# Patient Record
Sex: Male | Born: 1990 | Hispanic: Yes | Marital: Married | Smoking: Current some day smoker
Health system: Southern US, Community
[De-identification: ages and names within clinical notes are randomized; demographics above are authoritative.]

---

## 2013-10-22 ENCOUNTER — Emergency Department (HOSPITAL_COMMUNITY)
Admission: EM | Admit: 2013-10-22 | Discharge: 2013-10-22 | Disposition: A | Discharge status: Home / Self Care | Payer: Self-pay | Attending: Emergency Medicine | Admitting: Emergency Medicine

## 2013-10-22 ENCOUNTER — Encounter (HOSPITAL_COMMUNITY): Payer: Self-pay | Admitting: Emergency Medicine

## 2013-10-22 ENCOUNTER — Emergency Department (HOSPITAL_COMMUNITY): Payer: Self-pay

## 2013-10-22 DIAGNOSIS — R630 Anorexia: Secondary | ICD-10-CM | POA: Insufficient documentation

## 2013-10-22 DIAGNOSIS — Z720 Tobacco use: Secondary | ICD-10-CM | POA: Insufficient documentation

## 2013-10-22 DIAGNOSIS — R112 Nausea with vomiting, unspecified: Secondary | ICD-10-CM | POA: Insufficient documentation

## 2013-10-22 DIAGNOSIS — R109 Unspecified abdominal pain: Secondary | ICD-10-CM

## 2013-10-22 DIAGNOSIS — R6883 Chills (without fever): Secondary | ICD-10-CM | POA: Insufficient documentation

## 2013-10-22 DIAGNOSIS — R1011 Right upper quadrant pain: Secondary | ICD-10-CM | POA: Insufficient documentation

## 2013-10-22 DIAGNOSIS — R1013 Epigastric pain: Secondary | ICD-10-CM | POA: Insufficient documentation

## 2013-10-22 LAB — CBC WITH DIFFERENTIAL/PLATELET
Basophils Absolute: 0.1 10*3/uL (ref 0.0–0.1)
Basophils Relative: 0 % (ref 0–1)
EOS ABS: 0.7 10*3/uL (ref 0.0–0.7)
Eosinophils Relative: 5 % (ref 0–5)
HCT: 46.2 % (ref 39.0–52.0)
HEMOGLOBIN: 16.7 g/dL (ref 13.0–17.0)
LYMPHS ABS: 1.9 10*3/uL (ref 0.7–4.0)
Lymphocytes Relative: 13 % (ref 12–46)
MCH: 30.8 pg (ref 26.0–34.0)
MCHC: 36.1 g/dL — ABNORMAL HIGH (ref 30.0–36.0)
MCV: 85.1 fL (ref 78.0–100.0)
Monocytes Absolute: 0.6 10*3/uL (ref 0.1–1.0)
Monocytes Relative: 4 % (ref 3–12)
NEUTROS PCT: 78 % — AB (ref 43–77)
Neutro Abs: 11.5 10*3/uL — ABNORMAL HIGH (ref 1.7–7.7)
Platelets: 317 10*3/uL (ref 150–400)
RBC: 5.43 MIL/uL (ref 4.22–5.81)
RDW: 12.7 % (ref 11.5–15.5)
WBC: 14.7 10*3/uL — ABNORMAL HIGH (ref 4.0–10.5)

## 2013-10-22 LAB — COMPREHENSIVE METABOLIC PANEL
ALBUMIN: 4.6 g/dL (ref 3.5–5.2)
ALK PHOS: 123 U/L — AB (ref 39–117)
ALT: 61 U/L — AB (ref 0–53)
ANION GAP: 15 (ref 5–15)
AST: 54 U/L — ABNORMAL HIGH (ref 0–37)
BUN: 10 mg/dL (ref 6–23)
CHLORIDE: 97 meq/L (ref 96–112)
CO2: 28 mEq/L (ref 19–32)
Calcium: 9.6 mg/dL (ref 8.4–10.5)
Creatinine, Ser: 0.66 mg/dL (ref 0.50–1.35)
GFR calc Af Amer: >90 mL/min (ref >90)
GFR calc non Af Amer: >90 mL/min (ref >90)
Glucose, Bld: 122 mg/dL — ABNORMAL HIGH (ref 70–99)
POTASSIUM: 3.6 meq/L — AB (ref 3.7–5.3)
SODIUM: 140 meq/L (ref 137–147)
Total Bilirubin: 0.9 mg/dL (ref 0.3–1.2)
Total Protein: 8.6 g/dL — ABNORMAL HIGH (ref 6.0–8.3)

## 2013-10-22 LAB — URINALYSIS, ROUTINE W REFLEX MICROSCOPIC
Bilirubin Urine: NEGATIVE
Glucose, UA: NEGATIVE mg/dL
HGB URINE DIPSTICK: NEGATIVE
Ketones, ur: NEGATIVE mg/dL
Leukocytes, UA: NEGATIVE
NITRITE: NEGATIVE
Protein, ur: NEGATIVE mg/dL
SPECIFIC GRAVITY, URINE: 1.02 (ref 1.005–1.030)
Urobilinogen, UA: 0.2 mg/dL (ref 0.0–1.0)
pH: 6 (ref 5.0–8.0)

## 2013-10-22 LAB — LIPASE, BLOOD: Lipase: 21 U/L (ref 11–59)

## 2013-10-22 MED ORDER — IOHEXOL 300 MG/ML  SOLN
100.0000 mL | Freq: Once | INTRAMUSCULAR | Status: AC | PRN
  Administered 2013-10-22: 100 mL via INTRAVENOUS

## 2013-10-22 MED ORDER — ONDANSETRON HCL 4 MG/2ML IJ SOLN
4.0000 mg | Freq: Once | INTRAMUSCULAR | Status: AC
  Administered 2013-10-22: 4 mg via INTRAVENOUS

## 2013-10-22 MED ORDER — ONDANSETRON HCL 4 MG PO TABS: 4.0000 mg | ORAL_TABLET | Freq: Three times a day (TID) | ORAL | Status: AC | PRN

## 2013-10-22 MED ORDER — SODIUM CHLORIDE 0.9 % IJ SOLN
INTRAMUSCULAR | Status: AC
  Filled 2013-10-22: qty 400

## 2013-10-22 MED ORDER — ONDANSETRON HCL 4 MG/2ML IJ SOLN
4.0000 mg | Freq: Once | INTRAMUSCULAR | Status: DC
  Filled 2013-10-22: qty 2

## 2013-10-22 MED ORDER — HYDROMORPHONE HCL 1 MG/ML IJ SOLN
1.0000 mg | Freq: Once | INTRAMUSCULAR | Status: AC
  Administered 2013-10-22: 1 mg via INTRAVENOUS
  Filled 2013-10-22: qty 1

## 2013-10-22 MED ORDER — SODIUM CHLORIDE 0.9 % IV BOLUS (SEPSIS)
1000.0000 mL | Freq: Once | INTRAVENOUS | Status: AC
  Administered 2013-10-22: 1000 mL via INTRAVENOUS

## 2013-10-22 MED ORDER — IOHEXOL 300 MG/ML  SOLN
50.0000 mL | Freq: Once | INTRAMUSCULAR | Status: AC | PRN
Start: 2013-10-22 — End: 2013-10-22
  Administered 2013-10-22: 50 mL via ORAL

## 2013-10-22 MED ORDER — HYDROCODONE-ACETAMINOPHEN 5-325 MG PO TABS: 2.0000 | ORAL_TABLET | ORAL | Status: AC | PRN

## 2013-10-22 MED ORDER — OXYCODONE-ACETAMINOPHEN 5-325 MG PO TABS: 1.0000 | ORAL_TABLET | Freq: Four times a day (QID) | ORAL | Status: AC | PRN

## 2013-10-22 NOTE — ED Notes (Signed)
Pt alert & oriented x4, stable gait. Patient  given discharge instructions, paperwork & prescription(s). Patient verbalized understanding. Pt left department w/ no further questions. 

## 2013-10-22 NOTE — ED Notes (Signed)
MD at bedside. 

## 2013-10-22 NOTE — ED Provider Notes (Signed)
CSN: [id number]     Arrival date & time 10/22/13  1718 History   First MD Initiated Contact with Patient 10/22/13 1743     Chief Complaint  Patient presents with  . Abdominal Pain     (Consider location/radiation/quality/duration/timing/severity/associated sxs/prior Treatment) Patient is a 23 y.o. male presenting with abdominal pain. The history is provided by a friend and the patient. The history is limited by a language barrier.  Abdominal Pain Pain location:  Epigastric Pain quality: aching   Pain radiates to:  Does not radiate Pain severity:  Moderate Onset quality:  Gradual Duration:  1 day Timing:  Constant Progression:  Unchanged Chronicity:  New Context: suspicious food intake (tamales yesterday)   Relieved by:  Nothing Worsened by:  Palpation and eating Ineffective treatments:  None tried Associated symptoms: anorexia, chills, nausea and vomiting   Associated symptoms: no constipation, no diarrhea and no fever     History reviewed. No pertinent past medical history. History reviewed. No pertinent past surgical history. No family history on file. History  Substance Use Topics  . Smoking status: Current Some Day Smoker  . Smokeless tobacco: Not on file  . Alcohol Use: Yes     Comment: occasional    Review of Systems  Constitutional: Positive for chills. Negative for fever.  Gastrointestinal: Positive for nausea, vomiting, abdominal pain and anorexia. Negative for diarrhea and constipation.  All other systems reviewed and are negative.     Allergies  Review of patient's allergies indicates no known allergies.  Home Medications   Prior to Admission medications   Medication Sig Start Date End Date Taking? Authorizing Provider  bismuth subsalicylate (PEPTO BISMOL) 262 MG/15ML suspension Take 30 mLs by mouth every 6 (six) hours as needed for indigestion.   Yes Historical Provider, MD  HYDROcodone-acetaminophen (NORCO/VICODIN) 5-325 MG per tablet Take 2  tablets by mouth every 4 (four) hours as needed for moderate pain. 10/22/13   Mirian Mo, MD  ondansetron (ZOFRAN) 4 MG tablet Take 1 tablet (4 mg total) by mouth every 8 (eight) hours as needed for nausea or vomiting. 10/22/13   Mirian Mo, MD  oxyCODONE-acetaminophen (PERCOCET/ROXICET) 5-325 MG per tablet Take 1-2 tablets by mouth every 6 (six) hours as needed for severe pain. 10/22/13   Mirian Mo, MD   BP 125/68  Pulse 73  Temp(Src) 98.4 F (36.9 C) (Oral)  Resp 20  Wt 141 lb 11.2 oz (64.275 kg)  SpO2 98% Physical Exam  Vitals reviewed. Constitutional: He is oriented to person, place, and time. He appears well-developed and well-nourished.  HENT:  Head: Normocephalic and atraumatic.  Eyes: Conjunctivae and EOM are normal.  Neck: Normal range of motion. Neck supple.  Cardiovascular: Normal rate, regular rhythm and normal heart sounds.   Pulmonary/Chest: Effort normal and breath sounds normal. No respiratory distress.  Abdominal: He exhibits no distension. There is tenderness in the right upper quadrant and epigastric area. There is no rebound and no guarding.  Musculoskeletal: Normal range of motion.  Neurological: He is alert and oriented to person, place, and time.  Skin: Skin is warm and dry.    ED Course  Procedures (including critical care time) Labs Review Labs Reviewed  COMPREHENSIVE METABOLIC PANEL - Abnormal; Notable for the following:    Potassium 3.6 (*)    Glucose, Bld 122 (*)    Total Protein 8.6 (*)    AST 54 (*)    ALT 61 (*)    Alkaline Phosphatase 123 (*)    All  other components within normal limits  CBC WITH DIFFERENTIAL - Abnormal; Notable for the following:    WBC 14.7 (*)    MCHC 36.1 (*)    Neutrophils Relative % 78 (*)    Neutro Abs 11.5 (*)    All other components within normal limits  URINALYSIS, ROUTINE W REFLEX MICROSCOPIC  LIPASE, BLOOD    Imaging Review Ct Abdomen Pelvis W Contrast  10/22/2013   CLINICAL DATA:  Initial  encounter for epigastric pain lasting 1 day.  EXAM: CT ABDOMEN AND PELVIS WITH CONTRAST  TECHNIQUE: Multidetector CT imaging of the abdomen and pelvis was performed using the standard protocol following bolus administration of intravenous contrast.  CONTRAST:  50mL OMNIPAQUE IOHEXOL 300 MG/ML SOLN, 100mL OMNIPAQUE IOHEXOL 300 MG/ML SOLN  COMPARISON:  None.  FINDINGS: Subsegmental atelectasis is present at the lung bases bilaterally. The heart size is normal. No significant pleural or pericardial effusion is present.  The liver and spleen are within normal limits. The stomach, duodenum, and pancreas are within normal limits. The common bile duct and gallbladder are normal. The adrenal glands are normal bilaterally. The kidneys and ureters are unremarkable.  The rectosigmoid colon is within normal limits. The remainder the colon is within normal limits. The appendix is visualized and normal. Small bowel is unremarkable.  The bone windows demonstrate no focal lytic or blastic lesions.  IMPRESSION: Normal appearance of the abdomen and pelvis. No acute or focal lesion to explain the patient's symptoms.   Electronically Signed   By: Gennette Pachris  Mattern M.D.   On: 10/22/2013 21:15     EKG Interpretation None      MDM   Final diagnoses:  Non-intractable vomiting with nausea, vomiting of unspecified type  Abdominal pain, acute    23 y.o. male without pertinent PMH presents with abdominal pain and vomiting x1 day. Patient suspicious food intake yesterday. On arrival today vitals signs and physical exam as above. Patient has been tolerant of by mouth intake. As he has epigastric and right upper quadrant tenderness obtained lab work and CT scan as above. Labwork significant for mild transaminitis with elevated alkaline phosphatase, however CT scan was unremarkable.  Nature of constant pain and location make cholecystitis less likely, and with negative CT scan consider this unlikely. It is possible that the patient has  a viral gastritis versus early presentation of hepatitis. As he is tolerant of by mouth intake and has pain under control will have him followup with GI asap.  Patient was given standard return precautions, voiced understanding and agreed to followup.    1. Non-intractable vomiting with nausea, vomiting of unspecified type   2. Abdominal pain, acute         Mirian MoMatthew Ritika Hellickson, MD 10/22/13 2228

## 2013-10-22 NOTE — Discharge Instructions (Signed)
Dolor abdominal (Abdominal Pain) El dolor puede tener muchas causas. Normalmente la causa del dolor abdominal no es una enfermedad y Scientist, clinical (histocompatibility and immunogenetics) sin TEFL teacher. Frecuentemente puede controlarse y tratarse en casa. Su mdico le Medical sales representative examen fsico y posiblemente solicite anlisis de sangre y radiografas para ayudar a Chief Strategy Officer la gravedad de su dolor. Sin embargo, en [employer], debe transcurrir ms tiempo antes de que se pueda Clinical research associate una causa evidente del dolor. Antes de llegar a ese punto, es posible que su mdico no sepa si necesita ms pruebas o un tratamiento ms profundo. INSTRUCCIONES PARA EL CUIDADO EN EL HOGAR  Est atento al dolor para ver si hay cambios. Las siguientes indicaciones ayudarn a Architectural technologist que pueda sentir:  [place] solo medicamentos de venta libre o recetados, segn las indicaciones del mdico.  No tome laxantes a menos que se lo haya indicado su mdico.  Pruebe con [place]omia Dear dieta lquida absoluta (caldo, t o agua) segn se lo indique su mdico. Introduzca gradualmente una dieta normal, segn su tolerancia. SOLICITE ATENCIN MDICA SI:  Tiene dolor abdominal sin explicacin.  Tiene dolor abdominal relacionado con nuseas o diarrea.  Tiene dolor cuando orina o defeca.  Experimenta dolor abdominal que lo despierta de noche.  Tiene dolor abdominal que empeora o mejora cuando come alimentos.  Tiene dolor abdominal que empeora cuando come alimentos grasosos.  Tiene fiebre. SOLICITE ATENCIN MDICA DE INMEDIATO SI:   El dolor no desaparece en un plazo mximo de 2horas.  No deja de (vomitar).  El Engineer, mining se siente solo en partes del abdomen, como el lado derecho o la parte inferior izquierda del abdomen.  Evaca materia fecal sanguinolenta o negra, de aspecto alquitranado. ASEGRESE DE QUE:  Comprende estas instrucciones.  Controlar su afeccin.  Recibir ayuda de inmediato si no mejora o si empeora. Document Released: 01/05/2005  Document Revised: 01/10/2013 [facility] Patient Information 2015 [place], [place]. This information is not intended to replace advice given to you by your health care provider. Make sure you discuss any questions you have with your health care provider. Gastroenteritis viral (Viral Gastroenteritis) La gastroenteritis viral tambin es conocida como gripe del [place]. Este trastorno [employer] y el tubo digestivo. Puede causar diarrea y vmitos repentinos. La enfermedad generalmente dura entre 3 y [address]. La [employer] de las personas desarrolla una respuesta inmunolgica. Con el tiempo, esto elimina el virus. Mientras se desarrolla esta respuesta natural, el virus puede afectar en forma importante su salud.  CAUSAS Muchos virus diferentes pueden causar gastroenteritis, por ejemplo el rotavirus o el norovirus. Estos virus pueden contagiarse al consumir alimentos o agua contaminados. Tambin puede contagiarse al compartir utensilios u otros artculos personales con una persona infectada o al tocar una superficie contaminada.  SNTOMAS Los sntomas ms comunes son diarrea y vmitos. Estos problemas pueden causar una prdida grave de lquidos corporales(deshidratacin) y un desequilibrio de sales corporales(electrolitos). Otros sntomas pueden ser:   Grant Ruts.  Dolor de [place].  Fatiga.  Dolor abdominal. DIAGNSTICO  El mdico podr hacer el diagnstico de gastroenteritis viral basndose en los sntomas y el examen fsico Tambin pueden tomarle una muestra de materia fecal para diagnosticar la presencia de virus u otras infecciones.  TRATAMIENTO Esta enfermedad generalmente desaparece sin tratamiento. Los tratamientos estn dirigidos a Social research officer, government. Los casos ms graves de gastroenteritis viral implican vmitos tan intensos que no es posible retener lquidos. En [employer], los lquidos deben administrarse a travs de una va intravenosa (IV).  INSTRUCCIONES PARA EL CUIDADO DOMICILIARIO  Albesa Seen  suficientes lquidos para mantener la orina clara o de color amarillo plido. Beba pequeas cantidades de lquido con frecuencia y aumente la cantidad segn la tolerancia.  Pida instrucciones especficas a su mdico con respecto a la rehidratacin.  Evite:  Alimentos que Nurse, adulttengan mucha azcar.  Alcohol.  Gaseosas.  Tabaco[place]ista Lawman.  Jugos.  Bebidas con cafena.  Lquidos muy calientes o fros.  Alimentos muy grasos.  Comer demasiado a Licensed conveyancerla vez.  Productos lcteos hasta 24 a 48 horas despus de que se detenga la diarrea.  Puede consumir probiticos. Los probiticos son cultivos activos de bacterias beneficiosas. Pueden disminuir la cantidad y el nmero de deposiciones diarreicas en el adulto. Se encuentran en los yogures con cultivos activos y en los suplementos.  Lave bien sus manos para evitar que se disemine el virus.  Slo tome medicamentos de venta libre o recetados para Primary school teachercalmar el dolor, las molestias o bajar la fiebre segn las indicaciones de su mdico. No administre aspirina a los nios. Los medicamentos antidiarreicos no son recomendables.  Consulte a su mdico si puede seguir tomando sus medicamentos recetados o de H. J. Heinzventa libre.  [place] con todas las visitas de control, segn le indique su mdico. SOLICITE ATENCIN MDICA DE INMEDIATO SI:  No puede retener lquidos.  No hay emisin de orina durante 6 a 8 horas.  Le falta el aire.  Observa sangre en el vmito (se ve como caf molido) o en la materia fecal.  Siente dolor abdominal que empeora o se concentra en una zona pequea (se localiza).  Tiene nuseas o vmitos persistentes.  Tiene fiebre.  El paciente es un nio menor de 3 meses y [place] fiebre.  El paciente es un nio mayor de 3 meses, tiene fiebre y sntomas persistentes.  El paciente es un nio mayor de 3 meses y tiene fiebre y sntomas que empeoran repentinamente.  El paciente es un beb y no tiene lgrimas cuando llora. ASEGRESE QUE:   Comprende estas  instrucciones.  Controlar su enfermedad.  Solicitar ayuda inmediatamente si no mejora o si empeora. Document Released: 01/05/2005 Document Revised: 03/30/2011 [facility] Patient Information 2015 [place], [place]. This information is not intended to replace advice given to you by your health care provider. Make sure you discuss any questions you have with your health care provider.

## 2013-10-22 NOTE — ED Notes (Signed)
Pt has mid upper abdominal pain which began today and has been associated with nausea and chills, no fever.  Pt denies any vomiting or diarrhea, LBM today and normal.  Pt denies any urinary symptoms.

## 2013-10-30 MED FILL — Oxycodone w/ Acetaminophen Tab 5-325 MG: ORAL | Qty: 6 | Status: AC

## 2015-03-19 IMAGING — CT CT ABD-PELV W/ CM
2 of 4 series · 16 of 46 positions shown, 18 images · IV contrast (Omnipaque 300)
Comparison: None.

CLINICAL DATA: Initial encounter for epigastric pain lasting 1 day.

EXAM:
CT ABDOMEN AND PELVIS WITH CONTRAST
TECHNIQUE: Multidetector CT imaging of the abdomen and pelvis was performed
using the standard protocol following bolus administration of
intravenous contrast.
CONTRAST:  50mL OMNIPAQUE IOHEXOL 300 MG/ML SOLN, 100mL OMNIPAQUE
IOHEXOL 300 MG/ML SOLN

[Series 2: abd_pel_with 5.0 b40f · axial · 0.63mm/px · z∈[-478,-42]mm · 13 of 95 slices shown, 15 images]
[im 4/95  soft-tissue]
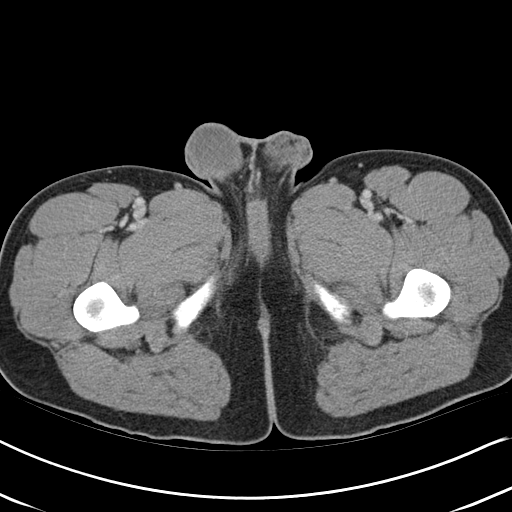
[im 4/95  bone]
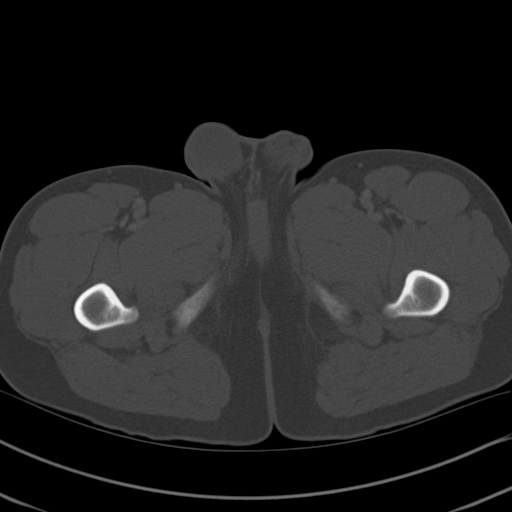
[im 12/95  soft-tissue]
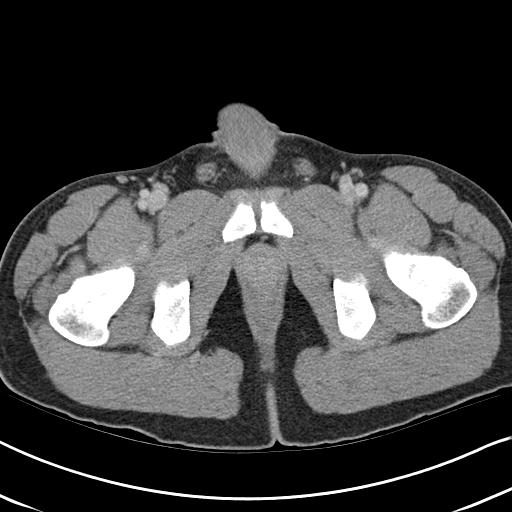
[im 20/95  soft-tissue]
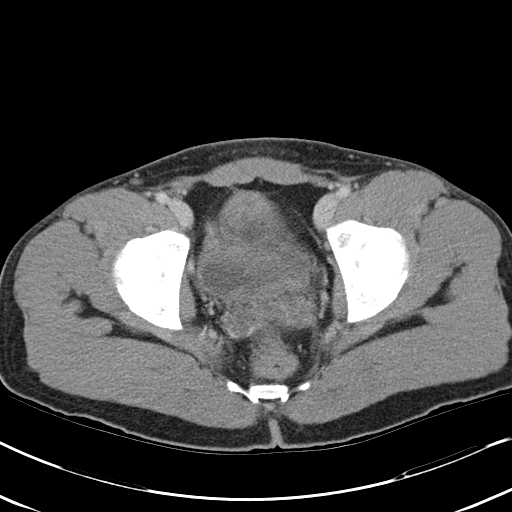
[im 28/95  soft-tissue]
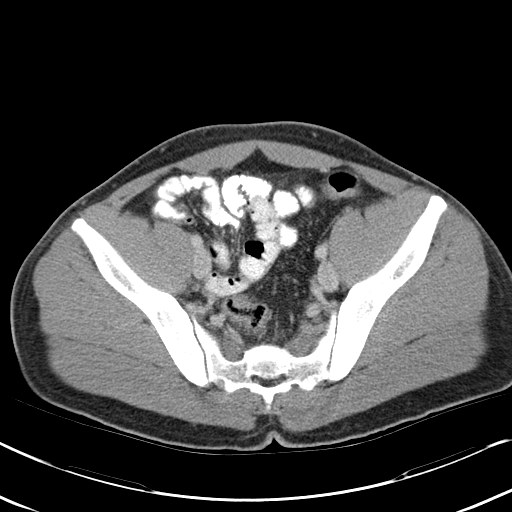
[im 32/95  soft-tissue]
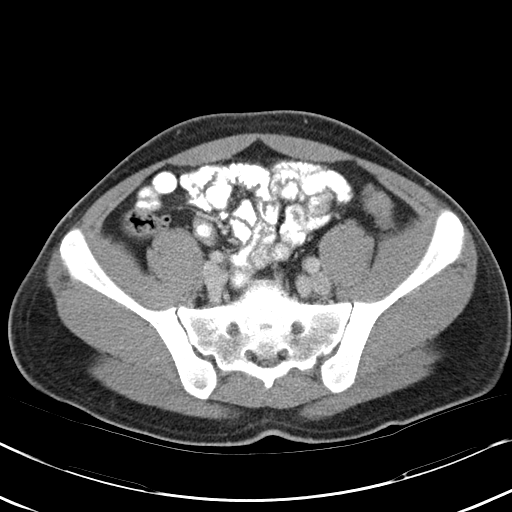
[im 40/95  soft-tissue]
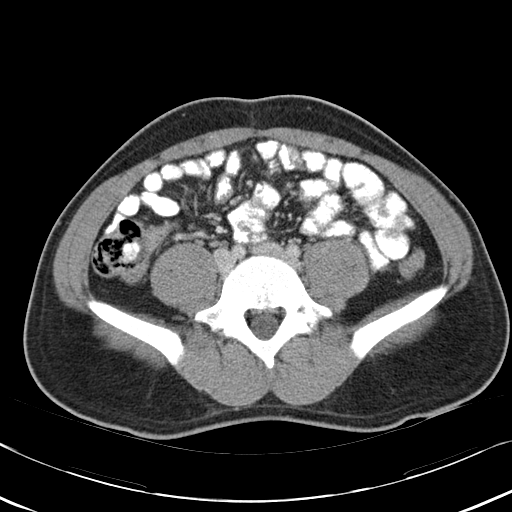
[im 48/95  soft-tissue]
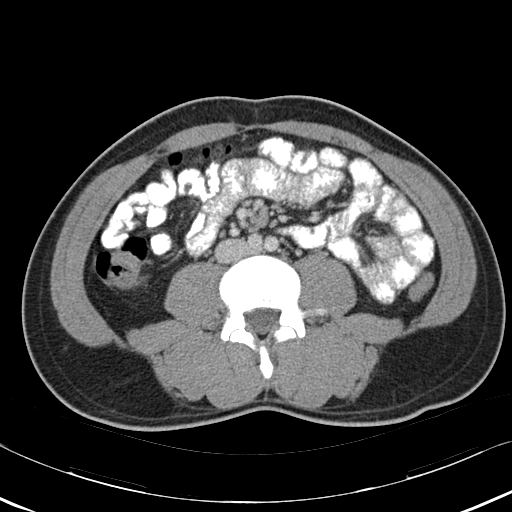
[im 55/95  soft-tissue]
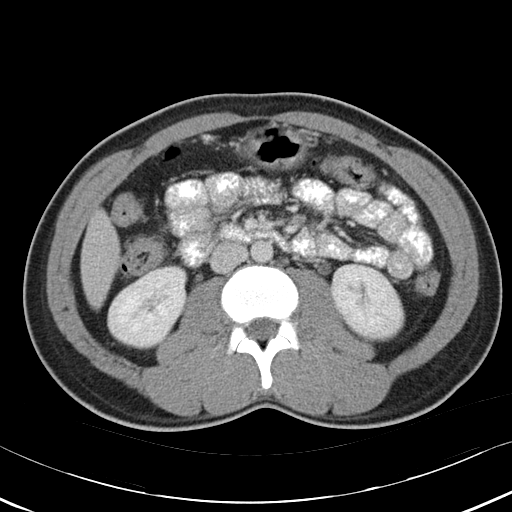
[im 63/95  soft-tissue]
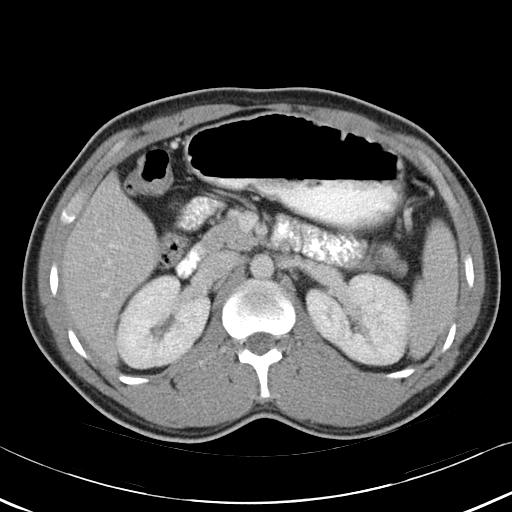
[im 63/95  bone]
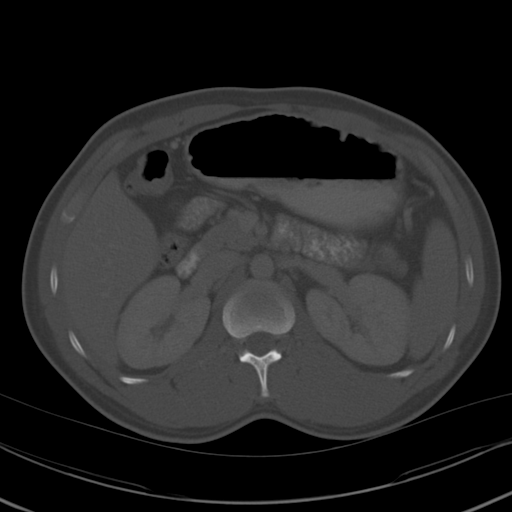
[im 67/95  soft-tissue]
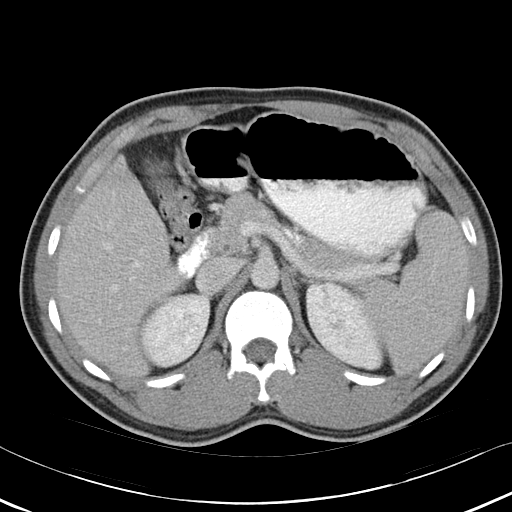
[im 75/95  soft-tissue]
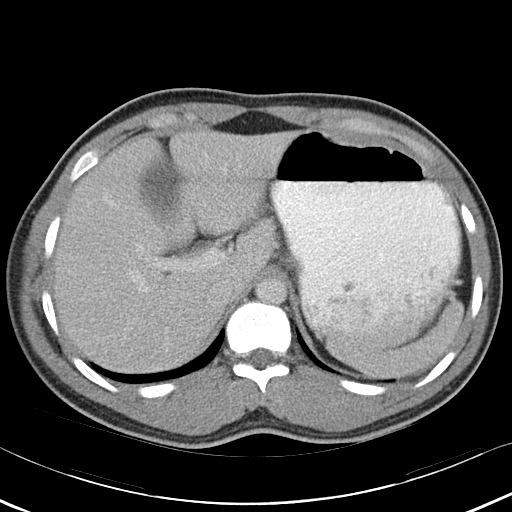
[im 83/95  soft-tissue]
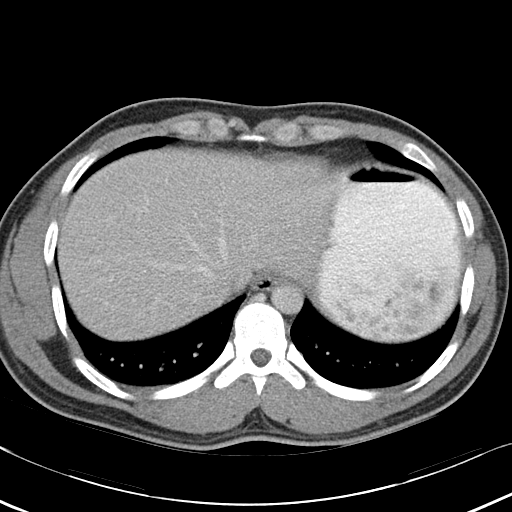
[im 91/95  soft-tissue]
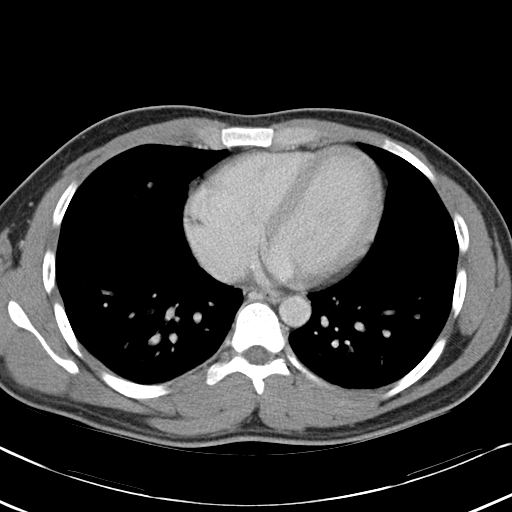

[Series 3: abd_pel_with 3.0 spo cor · coronal · 0.62mm/px · 3 of 71 slices shown]
[im 24/71  soft-tissue]
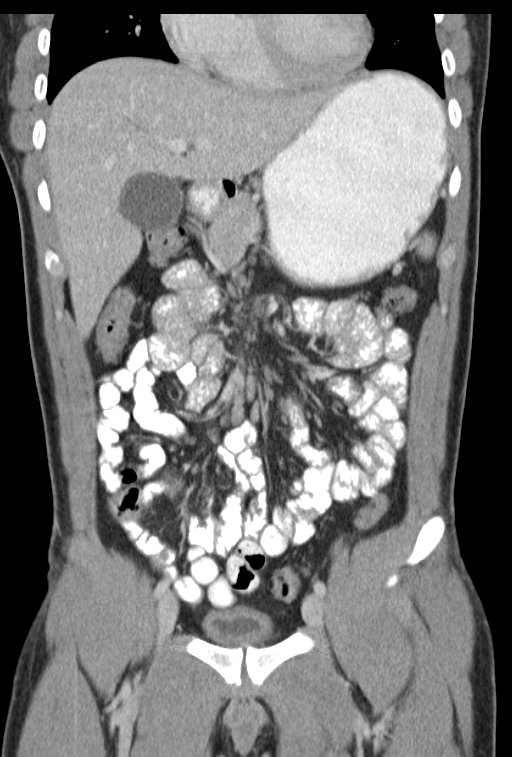
[im 32/71  soft-tissue]
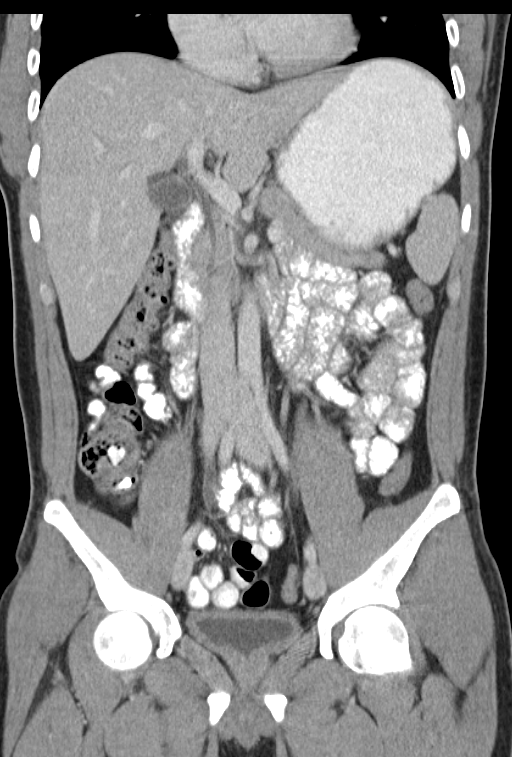
[im 39/71  soft-tissue]
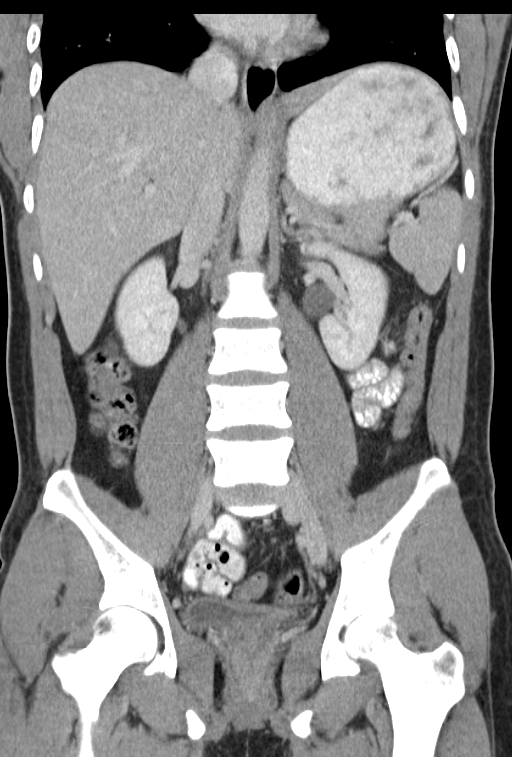

[16 of 46 positions shown; findings below may reference images not displayed]

FINDINGS: Subsegmental atelectasis is present at the lung bases bilaterally.
The heart size is normal. No significant pleural or pericardial
effusion is present.

The liver and spleen are within normal limits. The stomach,
duodenum, and pancreas are within normal limits. The common bile
duct and gallbladder are normal. The adrenal glands are normal
bilaterally. The kidneys and ureters are unremarkable.

The rectosigmoid colon is within normal limits. The remainder the
colon is within normal limits. The appendix is visualized and
normal. Small bowel is unremarkable.

The bone windows demonstrate no focal lytic or blastic lesions.
IMPRESSION: Normal appearance of the abdomen and pelvis. No acute or focal
lesion to explain the patient's symptoms.
